# Patient Record
Sex: Male | Born: 1955 | Race: White | Hispanic: No | Marital: Married | Smoking: Current every day smoker
Health system: Southern US, Community
[De-identification: ages and names within clinical notes are randomized; demographics above are authoritative.]

## PROBLEM LIST (undated history)

## (undated) DIAGNOSIS — F17210 Nicotine dependence, cigarettes, uncomplicated: Secondary | ICD-10-CM

## (undated) DIAGNOSIS — I219 Acute myocardial infarction, unspecified: Secondary | ICD-10-CM

## (undated) DIAGNOSIS — Z95828 Presence of other vascular implants and grafts: Secondary | ICD-10-CM

## (undated) DIAGNOSIS — E7800 Pure hypercholesterolemia, unspecified: Secondary | ICD-10-CM

## (undated) DIAGNOSIS — K219 Gastro-esophageal reflux disease without esophagitis: Secondary | ICD-10-CM

## (undated) DIAGNOSIS — Z122 Encounter for screening for malignant neoplasm of respiratory organs: Secondary | ICD-10-CM

## (undated) HISTORY — PX: APPENDECTOMY: SHX54

## (undated) HISTORY — PX: HERNIA REPAIR: SHX51

---

## 2015-10-19 ENCOUNTER — Emergency Department
Admission: EM | Admit: 2015-10-19 | Discharge: 2015-10-19 | Disposition: A | Discharge status: Home / Self Care | Payer: Medicare HMO | Attending: Emergency Medicine | Admitting: Emergency Medicine

## 2015-10-19 ENCOUNTER — Encounter: Payer: Self-pay | Admitting: *Deleted

## 2015-10-19 ENCOUNTER — Emergency Department: Payer: Medicare HMO

## 2015-10-19 DIAGNOSIS — I252 Old myocardial infarction: Secondary | ICD-10-CM | POA: Diagnosis not present

## 2015-10-19 DIAGNOSIS — G629 Polyneuropathy, unspecified: Secondary | ICD-10-CM | POA: Insufficient documentation

## 2015-10-19 DIAGNOSIS — R079 Chest pain, unspecified: Secondary | ICD-10-CM | POA: Diagnosis not present

## 2015-10-19 DIAGNOSIS — F17200 Nicotine dependence, unspecified, uncomplicated: Secondary | ICD-10-CM | POA: Insufficient documentation

## 2015-10-19 HISTORY — DX: Acute myocardial infarction, unspecified: I21.9

## 2015-10-19 HISTORY — DX: Presence of other vascular implants and grafts: Z95.828

## 2015-10-19 LAB — BASIC METABOLIC PANEL
ANION GAP: 8 (ref 5–15)
BUN: 9 mg/dL (ref 6–20)
CALCIUM: 9.2 mg/dL (ref 8.9–10.3)
CO2: 29 mmol/L (ref 22–32)
Chloride: 104 mmol/L (ref 101–111)
Creatinine, Ser: 1.08 mg/dL (ref 0.61–1.24)
GLUCOSE: 182 mg/dL — AB (ref 65–99)
POTASSIUM: 4 mmol/L (ref 3.5–5.1)
Sodium: 141 mmol/L (ref 135–145)

## 2015-10-19 LAB — CBC
HEMATOCRIT: 46.9 % (ref 40.0–52.0)
HEMOGLOBIN: 15.7 g/dL (ref 13.0–18.0)
MCH: 30.7 pg (ref 26.0–34.0)
MCHC: 33.5 g/dL (ref 32.0–36.0)
MCV: 91.7 fL (ref 80.0–100.0)
Platelets: 214 10*3/uL (ref 150–440)
RBC: 5.11 MIL/uL (ref 4.40–5.90)
RDW: 13.1 % (ref 11.5–14.5)
WBC: 8.8 10*3/uL (ref 3.8–10.6)

## 2015-10-19 LAB — TROPONIN I

## 2015-10-19 MED ORDER — GABAPENTIN 300 MG PO CAPS
300.0000 mg | ORAL_CAPSULE | Freq: Once | ORAL | Status: AC
  Administered 2015-10-19: 300 mg via ORAL

## 2015-10-19 MED ORDER — GABAPENTIN 300 MG PO CAPS
ORAL_CAPSULE | ORAL | Status: AC
  Administered 2015-10-19: 300 mg via ORAL
  Filled 2015-10-19: qty 1

## 2015-10-19 MED ORDER — GABAPENTIN 300 MG PO CAPS: 300.0000 mg | ORAL_CAPSULE | Freq: Two times a day (BID) | ORAL | Status: AC

## 2015-10-19 NOTE — ED Notes (Signed)
Pt c/o on/off again CP for last 2 weeks.  Pt sts CP is worse w/ exertion.  Pt denies n/v, diaphoresis, change in LOC.  Pt also c/o shoulder pain that he sts is separate from CP.

## 2015-10-19 NOTE — ED Notes (Addendum)
States left sided chest pain and left arm numbness, also states abd pain from a previous hernia, states some SOB when he breathes hard, states hx of MI and stents, states pain feels the same as previous MI, pt states he is on blood thinners but does not recall which one

## 2015-10-19 NOTE — Discharge Instructions (Signed)
You have been seen in the emergency department today for chest pain. Your workup has shown normal results. As we discussed please follow-up with your primary care physician in the next 1-2 days for recheck. Return to the emergency department for any further chest pain, trouble breathing, or any other symptom personally concerning to yourself. ° °Nonspecific Chest Pain °It is often hard to find the cause of chest pain. There is always a chance that your pain could be related to something serious, such as a heart attack or a blood clot in your lungs. Chest pain can also be caused by conditions that are not life-threatening. If you have chest pain, it is very important to follow up with your doctor. ° °HOME CARE °· If you were prescribed an antibiotic medicine, finish it all even if you start to feel better. °· Avoid any activities that cause chest pain. °· Do not use any tobacco products, including cigarettes, chewing tobacco, or electronic cigarettes. If you need help quitting, ask your doctor. °· Do not drink alcohol. °· Take medicines only as told by your doctor. °· Keep all follow-up visits as told by your doctor. This is important. This includes any further testing if your chest pain does not go away. °· Your doctor may tell you to keep your head raised (elevated) while you sleep. °· Make lifestyle changes as told by your doctor. These may include: °¨ Getting regular exercise. Ask your doctor to suggest some activities that are safe for you. °¨ Eating a heart-healthy diet. Your doctor or a diet specialist (dietitian) can help you to learn healthy eating options. °¨ Maintaining a healthy weight. °¨ Managing diabetes, if necessary. °¨ Reducing stress. °GET HELP IF: °· Your chest pain does not go away, even after treatment. °· You have a rash with blisters on your chest. °· You have a fever. °GET HELP RIGHT AWAY IF: °· Your chest pain is worse. °· You have an increasing cough, or you cough up blood. °· You have  severe belly (abdominal) pain. °· You feel extremely weak. °· You pass out (faint). °· You have chills. °· You have sudden, unexplained chest discomfort. °· You have sudden, unexplained discomfort in your arms, back, neck, or jaw. °· You have shortness of breath at any time. °· You suddenly start to sweat, or your skin gets clammy. °· You feel nauseous. °· You vomit. °· You suddenly feel light-headed or dizzy. °· Your heart begins to beat quickly, or it feels like it is skipping beats. °These symptoms may be an emergency. Do not wait to see if the symptoms will go away. Get medical help right away. Call your local emergency services (911 in the [place] Do not drive yourself to the hospital. °  °This information is not intended to replace advice given to you by your health care provider. Make sure you discuss any questions you have with your health care provider. °  °Document Released: 03/07/2008 Document Revised: 10/10/2014 Document Reviewed: 04/25/2014 °Elsevier Interactive Patient Education ©2016 [employer] ° °

## 2015-10-19 NOTE — ED Provider Notes (Signed)
[facility] Emergency Department Provider Note  Time seen: 6:24 PM  I have reviewed the triage vital signs and the nursing notes.   HISTORY  Chief Complaint Chest Pain    HPI Harry Woods is a 60 y.o. male with a past medical history of MI status post stents 10 years ago who presents the emergency department with vague complaints of intermittent left arm pain and tingling for the past 6 months, neck pain for the past several years, and occasional left chest pain for the past several months. Denies any acute change today but states he just wanted to come get checked out to make sure his heart was okay as he has a history of cardiac stents 10 years ago. Denies any symptoms at this time signs some pain and tingling in his left upper extremity which she states again has been ongoing for many months. Denies any worsening today. Describes his chest pain as intermittent, mild for the past several months. Denies any acute worsening. Denies any shortness of breath, nausea, vomiting, diaphoresis. Patient states a history of a hiatal hernia, but denies any complications surrounding that, states normal bowel movements.    Past Medical History  Diagnosis Date  . H/O hernia repair   . MI (myocardial infarction) (HCC)   . Presence of stent in artery     There are no active problems to display for this patient.   Past Surgical History  Procedure Laterality Date  . Appendectomy      No current outpatient prescriptions on file.  Allergies Review of patient's allergies indicates no known allergies.  History reviewed. No pertinent family history.  Social History Social History  Substance Use Topics  . Smoking status: Current Every Day Smoker  . Smokeless tobacco: None  . Alcohol Use: None    Review of Systems Constitutional: Negative for fever. Cardiovascular: Admits chest pain times months. Respiratory: Negative for shortness of breath. Gastrointestinal:  Negative for abdominal pain Neurological: Negative for headache 10-point ROS otherwise negative.  ____________________________________________   PHYSICAL EXAM:  VITAL SIGNS: ED Triage Vitals  Enc Vitals Group     BP 10/19/15 1614 149/84 mmHg     Pulse Rate 10/19/15 1614 86     Resp 10/19/15 1614 18     Temp 10/19/15 1614 98.4 F (36.9 C)     Temp Source 10/19/15 1614 Oral     SpO2 10/19/15 1614 100 %     Weight 10/19/15 1614 140 lb (63.504 kg)     Height 10/19/15 1614 5\' 2"  (1.575 m)     Head Cir --      Peak Flow --      Pain Score 10/19/15 1614 6     Pain Loc --      Pain Edu? --      Excl. in GC? --     Constitutional: Alert and oriented. Well appearing and in no distress. Eyes: Normal exam ENT   Head: Normocephalic and atraumatic   Mouth/Throat: Mucous membranes are moist. Cardiovascular: Normal rate, regular rhythm. No murmur Respiratory: Normal respiratory effort without tachypnea nor retractions. Breath sounds are clear and equal bilaterally. No wheezes/rales/rhonchi. Gastrointestinal: Soft and nontender. No distention.  Musculoskeletal: Nontender with normal range of motion in all extremities. Neurologic:  Normal speech and language. No gross focal neurologic deficits are appreciated. Speech is normal. Equal grip strength bilateral upper extremities. Skin:  Skin is warm, dry and intact.  Psychiatric: Mood and affect are normal. Speech and behavior are  normal.  ____________________________________________    EKG  EKG reviewed and interpreted by myself shows normal sinus rhythm at 85 bpm, narrow QRS, normal axis, normal intervals, no ST changes. Overall normal EKG.  ____________________________________________    RADIOLOGY Chest x-ray shows no acute abnormalities  ____________________________________________    INITIAL IMPRESSION / ASSESSMENT AND PLAN / ED COURSE  Pertinent labs & imaging results that were available during my care of the  patient were reviewed by me and considered in my medical decision making (see chart for details).  Patient presents to the emergency department with intermittent left upper extremity numbness and tingling, intermittent chest pain, neck pain, SYMPTOMS have been ongoing for many months, some of which have been ongoing for several years. Denies any acute worsening. EKG is reassuring, chest x-ray is clear, labs within normal limits including a negative troponin. I discussed with the patient and need to follow-up with a primary care physician, he does not have one currently I will prefer him to [facility]. Patient is agreeable to plan. Given his likely neuropathy we will prescribe gabapentin and have him follow-up with his primary care physician soon as possible.  ____________________________________________   FINAL CLINICAL IMPRESSION(S) / ED DIAGNOSES  Chest pain Neuropathy   Minna AntisKevin Aviyon Hocevar, MD 10/19/15 [phone]

## 2016-02-20 ENCOUNTER — Emergency Department: Payer: Medicare HMO

## 2016-02-20 ENCOUNTER — Encounter: Payer: Self-pay | Admitting: Emergency Medicine

## 2016-02-20 ENCOUNTER — Emergency Department
Admission: EM | Admit: 2016-02-20 | Discharge: 2016-02-21 | Disposition: A | Discharge status: Home / Self Care | Payer: Medicare HMO | Attending: Student | Admitting: Student

## 2016-02-20 DIAGNOSIS — I252 Old myocardial infarction: Secondary | ICD-10-CM | POA: Diagnosis not present

## 2016-02-20 DIAGNOSIS — Z955 Presence of coronary angioplasty implant and graft: Secondary | ICD-10-CM | POA: Diagnosis not present

## 2016-02-20 DIAGNOSIS — R1111 Vomiting without nausea: Secondary | ICD-10-CM | POA: Insufficient documentation

## 2016-02-20 DIAGNOSIS — Z79899 Other long term (current) drug therapy: Secondary | ICD-10-CM | POA: Diagnosis not present

## 2016-02-20 DIAGNOSIS — F17210 Nicotine dependence, cigarettes, uncomplicated: Secondary | ICD-10-CM | POA: Insufficient documentation

## 2016-02-20 DIAGNOSIS — R1033 Periumbilical pain: Secondary | ICD-10-CM | POA: Diagnosis present

## 2016-02-20 HISTORY — DX: Gastro-esophageal reflux disease without esophagitis: K21.9

## 2016-02-20 HISTORY — DX: Pure hypercholesterolemia, unspecified: E78.00

## 2016-02-20 LAB — CBC
HEMATOCRIT: 48.9 % (ref 40.0–52.0)
HEMOGLOBIN: 16.9 g/dL (ref 13.0–18.0)
MCH: 31.1 pg (ref 26.0–34.0)
MCHC: 34.5 g/dL (ref 32.0–36.0)
MCV: 90.2 fL (ref 80.0–100.0)
Platelets: 126 10*3/uL — ABNORMAL LOW (ref 150–440)
RBC: 5.42 MIL/uL (ref 4.40–5.90)
RDW: 13.2 % (ref 11.5–14.5)
WBC: 9.3 10*3/uL (ref 3.8–10.6)

## 2016-02-20 LAB — COMPREHENSIVE METABOLIC PANEL
ALBUMIN: 4.6 g/dL (ref 3.5–5.0)
ALT: 20 U/L (ref 17–63)
ANION GAP: 8 (ref 5–15)
AST: 23 U/L (ref 15–41)
Alkaline Phosphatase: 78 U/L (ref 38–126)
BUN: 10 mg/dL (ref 6–20)
CO2: 28 mmol/L (ref 22–32)
Calcium: 9.5 mg/dL (ref 8.9–10.3)
Chloride: 102 mmol/L (ref 101–111)
Creatinine, Ser: 1.1 mg/dL (ref 0.61–1.24)
GFR calc non Af Amer: >60 mL/min (ref >60)
GLUCOSE: 146 mg/dL — AB (ref 65–99)
POTASSIUM: 3.5 mmol/L (ref 3.5–5.1)
SODIUM: 138 mmol/L (ref 135–145)
Total Bilirubin: 0.9 mg/dL (ref 0.3–1.2)
Total Protein: 7.7 g/dL (ref 6.5–8.1)

## 2016-02-20 LAB — URINALYSIS COMPLETE WITH MICROSCOPIC (ARMC ONLY)
BACTERIA UA: NONE SEEN
Bilirubin Urine: NEGATIVE
GLUCOSE, UA: NEGATIVE mg/dL
Ketones, ur: NEGATIVE mg/dL
Leukocytes, UA: NEGATIVE
NITRITE: NEGATIVE
PROTEIN: NEGATIVE mg/dL
SPECIFIC GRAVITY, URINE: 1.01 (ref 1.005–1.030)
pH: 5 (ref 5.0–8.0)

## 2016-02-20 LAB — TROPONIN I

## 2016-02-20 LAB — LIPASE, BLOOD: LIPASE: 23 U/L (ref 11–51)

## 2016-02-20 MED ORDER — IOPAMIDOL (ISOVUE-300) INJECTION 61%
100.0000 mL | Freq: Once | INTRAVENOUS | Status: AC | PRN
  Administered 2016-02-20: 100 mL via INTRAVENOUS

## 2016-02-20 MED ORDER — ONDANSETRON HCL 4 MG/2ML IJ SOLN
4.0000 mg | Freq: Once | INTRAMUSCULAR | Status: AC
  Administered 2016-02-20: 4 mg via INTRAVENOUS
  Filled 2016-02-20: qty 2

## 2016-02-20 MED ORDER — DIATRIZOATE MEGLUMINE & SODIUM 66-10 % PO SOLN
15.0000 mL | Freq: Once | ORAL | Status: AC
  Administered 2016-02-20: 15 mL via ORAL

## 2016-02-20 MED ORDER — MORPHINE SULFATE (PF) 4 MG/ML IV SOLN
4.0000 mg | Freq: Once | INTRAVENOUS | Status: AC
  Administered 2016-02-20: 4 mg via INTRAVENOUS
  Filled 2016-02-20: qty 1

## 2016-02-20 MED ORDER — SODIUM CHLORIDE 0.9 % IV BOLUS (SEPSIS)
500.0000 mL | Freq: Once | INTRAVENOUS | Status: AC
  Administered 2016-02-20: 500 mL via INTRAVENOUS

## 2016-02-20 MED ORDER — ONDANSETRON 4 MG PO TBDP: 4.0000 mg | ORAL_TABLET | Freq: Three times a day (TID) | ORAL | Status: AC | PRN

## 2016-02-20 NOTE — ED Notes (Addendum)
Pt c/o low midline abd pain; vomiting; denies urinary s/s; pt says he thinks the mesh from his hernia surgery 9 years ago has moved; pt says pain is constant sharp pain, worse when standing and moving;

## 2016-02-20 NOTE — ED Provider Notes (Signed)
[facility] Emergency Department Provider Note   ____________________________________________  Time seen: Approximately 10:32 PM  I have reviewed the triage vital signs and the nursing notes.   HISTORY  Chief Complaint Abdominal Pain    HPI Harry Woods is a 60 y.o. male history of coronary artery disease, GERD, HTN, hyperlipidemia history of appendectomy, history of hernia repair with mesh who presents for evaluation of 2-3 days of vomiting as well as periumbilical and lower abdominal pain, gradual onset, constant, moderate, no modifying factors. Patient reports that yesterday he had 3 episodes of nonbloody nonbilious emesis, one episode today. No fevers or chills. No diarrhea. No chest pain or difficulty breathing.   Past Medical History  Diagnosis Date  . MI (myocardial infarction) (HCC)   . Presence of stent in artery   . GERD (gastroesophageal reflux disease)   . Hypercholesteremia     There are no active problems to display for this patient.   Past Surgical History  Procedure Laterality Date  . Appendectomy    . Hernia repair      Current Outpatient Rx  Name  Route  Sig  Dispense  Refill  . famotidine (PEPCID) 20 MG tablet   Oral   Take 20 mg by mouth 2 (two) times daily.         Marland Kitchen lisinopril (PRINIVIL,ZESTRIL) 5 MG tablet   Oral   Take 5 mg by mouth 2 (two) times daily.         . meloxicam (MOBIC) 15 MG tablet   Oral   Take 15 mg by mouth daily.         . metoprolol tartrate (LOPRESSOR) 25 MG tablet   Oral   Take 25 mg by mouth 2 (two) times daily.         . niacin (NIASPAN) 500 MG CR tablet   Oral   Take 500 mg by mouth at bedtime.         . simvastatin (ZOCOR) 40 MG tablet   Oral   Take 40 mg by mouth at bedtime.         . gabapentin (NEURONTIN) 300 MG capsule   Oral   Take 1 capsule (300 mg total) by mouth 2 (two) times daily. Patient not taking: Reported on 02/20/2016   60 capsule   0      Allergies Review of patient's allergies indicates no known allergies.  History reviewed. No pertinent family history.  Social History Social History  Substance Use Topics  . Smoking status: Current Every Day Smoker -- 1.00 packs/day for 46 years    Types: Cigarettes  . Smokeless tobacco: None  . Alcohol Use: No    Review of Systems Constitutional: No fever/chills Eyes: No visual changes. ENT: No sore throat. Cardiovascular: Denies chest pain. Respiratory: Denies shortness of breath. Gastrointestinal: + abdominal pain.  + nausea, + vomiting.  No diarrhea.  No constipation. Genitourinary: Negative for dysuria. Musculoskeletal: Negative for back pain. Skin: Negative for rash. Neurological: Negative for headaches, focal weakness or numbness.  10-point ROS otherwise negative.  ____________________________________________   PHYSICAL EXAM:  Filed Vitals:   02/20/16 1939 02/20/16 2149  BP: 145/97 163/102  Pulse: 83 78  Temp: 98 F (36.7 C)   TempSrc: Oral   Resp: 18 18  Height:  (1.575 m)   Weight: 145 lb (65.772 kg)   SpO2: 97% 100%    VITAL SIGNS: ED Triage Vitals  Enc Vitals Group     BP 02/20/16  1939 145/97 mmHg     Pulse Rate 02/20/16 1939 83     Resp 02/20/16 1939 18     Temp 02/20/16 1939 98 F (36.7 C)     Temp Source 02/20/16 1939 Oral     SpO2 02/20/16 1939 97 %     Weight 02/20/16 1939 145 lb (65.772 kg)     Height 02/20/16 1939 5\' 2"  (1.575 m)     Head Cir --      Peak Flow --      Pain Score 02/20/16 1940 5     Pain Loc --      Pain Edu? --      Excl. in GC? --     Constitutional: Alert and oriented. Well appearing and in no acute distress. Eyes: Conjunctivae are normal. PERRL. EOMI. Head: Atraumatic. Nose: No congestion/rhinnorhea. Mouth/Throat: Mucous membranes are moist.  Oropharynx non-erythematous. Neck: No stridor.  Cardiovascular: Normal rate, regular rhythm. Grossly normal heart sounds.  Good peripheral  circulation. Respiratory: Normal respiratory effort.  No retractions. Lungs CTAB. Gastrointestinal: Soft With moderate periumbilical and lower abdominal tenderness, no rebound or guarding. No CVA tenderness. Genitourinary: deferred Musculoskeletal: No lower extremity tenderness nor edema.  No joint effusions. Neurologic:  Normal speech and language. No gross focal neurologic deficits are appreciated. No gait instability. Skin:  Skin is warm, dry and intact. No rash noted. Psychiatric: Mood and affect are normal. Speech and behavior are normal.  ____________________________________________   LABS (all labs ordered are listed, but only abnormal results are displayed)  Labs Reviewed  COMPREHENSIVE METABOLIC PANEL - Abnormal; Notable for the following:    Glucose, Bld 146 (*)    All other components within normal limits  CBC - Abnormal; Notable for the following:    Platelets 126 (*)    All other components within normal limits  URINALYSIS COMPLETEWITH MICROSCOPIC ([facility] ONLY) - Abnormal; Notable for the following:    Color, Urine YELLOW (*)    APPearance CLEAR (*)    Hgb urine dipstick 1+ (*)    Squamous Epithelial / LPF 0-5 (*)    All other components within normal limits  LIPASE, BLOOD  TROPONIN I   ____________________________________________  EKG  ED ECG REPORT I, Gayla DossGayle, Jermario Kalmar A, the attending physician, personally viewed and interpreted this ECG.   Date: 02/20/2016  EKG Time: 22:49  Rate: 85  Rhythm: normal sinus rhythm  Axis: normal  Intervals:none  ST&T Change: No acute ST elevation. RS are prime in V1.  ____________________________________________  RADIOLOGY  CT abdomen and pelvis IMPRESSION: 1. No acute abnormalities involving the abdomen or pelvis. 2. 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 3. Moderate prostate gland enlargement, particularly the median lobe. 4. Left adrenal mass statistically likely to  represent an adenoma.  ____________________________________________   PROCEDURES  Procedure(s) performed: None  Critical Care performed: No  ____________________________________________   INITIAL IMPRESSION / ASSESSMENT AND PLAN / ED COURSE  Pertinent labs & imaging results that were available during my care of the patient were reviewed by me and considered in my medical decision making (see chart for details).  Harry Woods is a 60 y.o. male history of coronary artery disease, GERD, HTN, hyperlipidemia history of appendectomy, history of hernia repair with mesh who presents for evaluation of 2-3 days of vomiting as well as periumbilical and lower abdominal pain. On exam, he is very well-appearing and in no acute distress, vital signs stable, he is afebrile. He does have significant tenderness in the  periumbilical region as well as the lower abdomen. EKG reassuring, doubt atypical ACS. CBC notable for mild thrombocytopenia, unremarkable CMP, normal lipase. Urinalysis not  consistent with infection. Given his age and significant tenderness, we'll obtain CT of the abdomen and pelvis to further evaluate the cause of his pain. We'll treat with morphine, Zofran, fluids, reassess for disposition.  ----------------------------------------- 11:17 PM on 02/20/2016 ----------------------------------------- Patient reports complete resolution of his nonspecific abdominal  pain at this time. He is tolerating by mouth intake without vomiting. CT scan shows no acute abnormality of the abdomen or pelvis. Troponin negative. We discussed return precautions, need for close PCP follow-up and he is comfortable with the discharge plan. DC home. ____________________________________________   FINAL CLINICAL IMPRESSION(S) / ED DIAGNOSES  Final diagnoses:  Periumbilical abdominal pain  Non-intractable vomiting without nausea, unspecified vomiting type      NEW MEDICATIONS STARTED DURING THIS  VISIT:  New Prescriptions   No medications on file     Note:  This document was prepared using Dragon voice recognition software and may include unintentional dictation errors.    Gayla Doss, MD 02/20/16 2320

## 2017-01-18 IMAGING — CT CT ABD-PELV W/ CM
2 of 5 series · 16 of 46 positions shown, 18 images · IV contrast (iopamidol)
Comparison: None.

CLINICAL DATA: 59-year-old presenting with 3 day history of
progressively worsening generalized abdominal pain associated with
nausea and vomiting. Surgical history includes appendectomy and
umbilical hernia repair.

EXAM:
CT ABDOMEN AND PELVIS WITH CONTRAST
TECHNIQUE: Multidetector CT imaging of the abdomen and pelvis was performed
using the standard protocol following bolus administration of
intravenous contrast.
CONTRAST:  100mL [id number] IOPAMIDOL 61% IV. Oral contrast was also
administered.

[Series 2: routine abd pel with · axial · 0.73mm/px · z∈[-440,-75]mm · 13 of 83 slices shown, 15 images]
[im 5/83  soft-tissue]
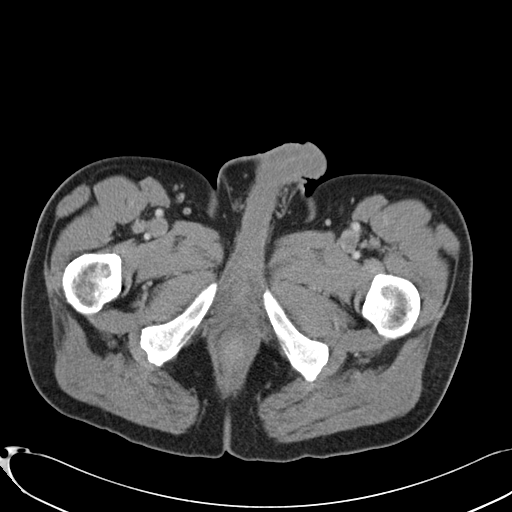
[im 5/83  bone]
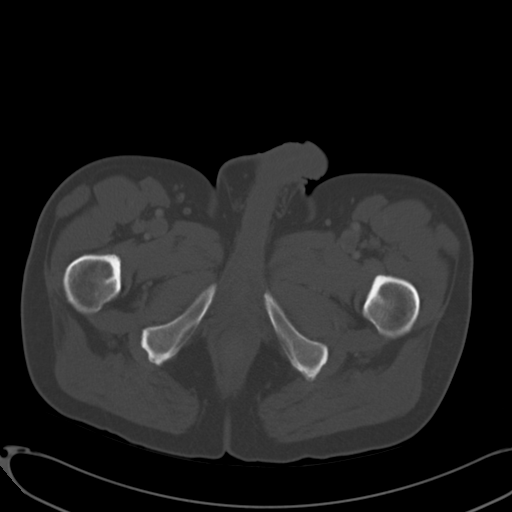
[im 13/83  soft-tissue]
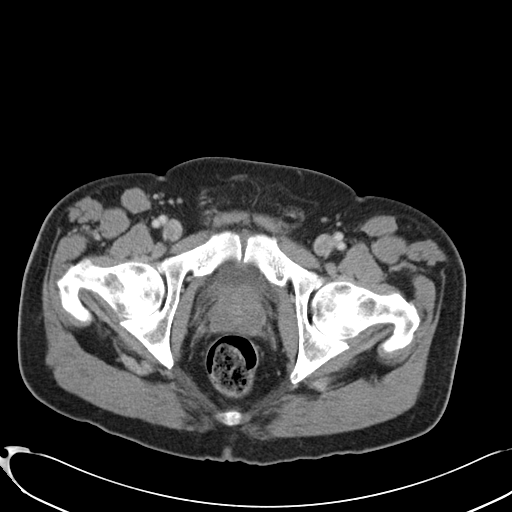
[im 18/83  soft-tissue]
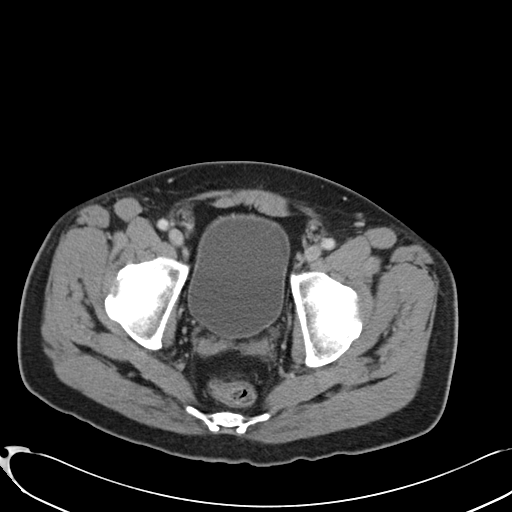
[im 22/83  soft-tissue]
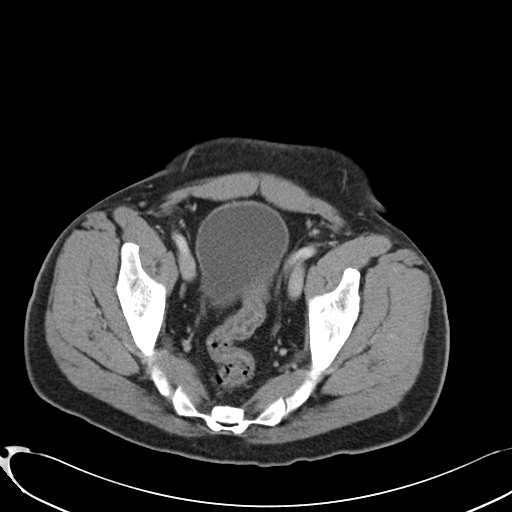
[im 31/83  soft-tissue]
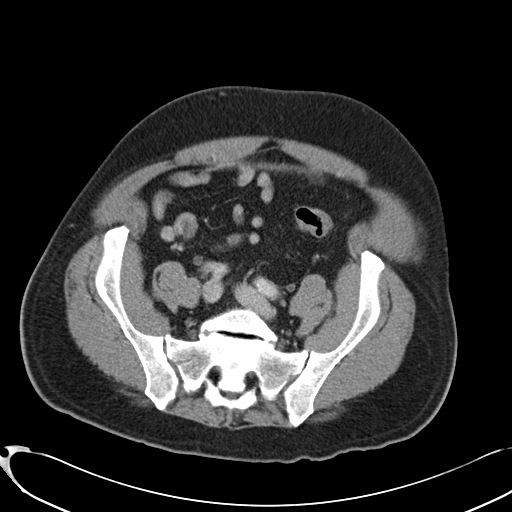
[im 35/83  soft-tissue]
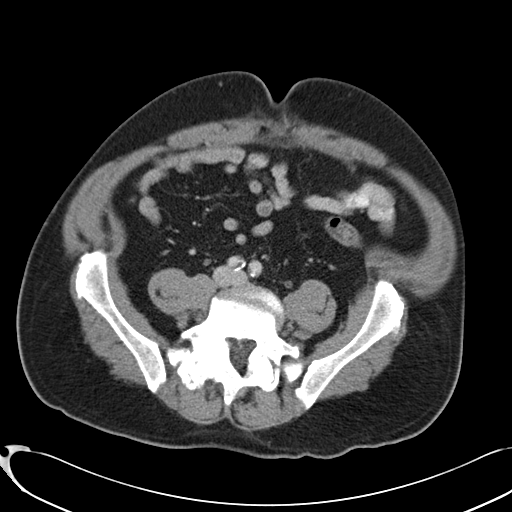
[im 44/83  soft-tissue]
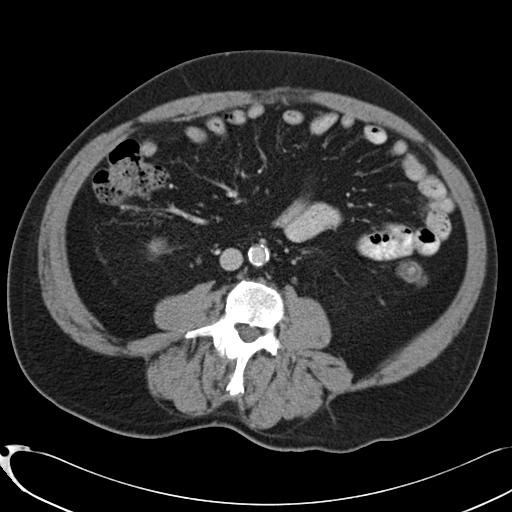
[im 48/83  soft-tissue]
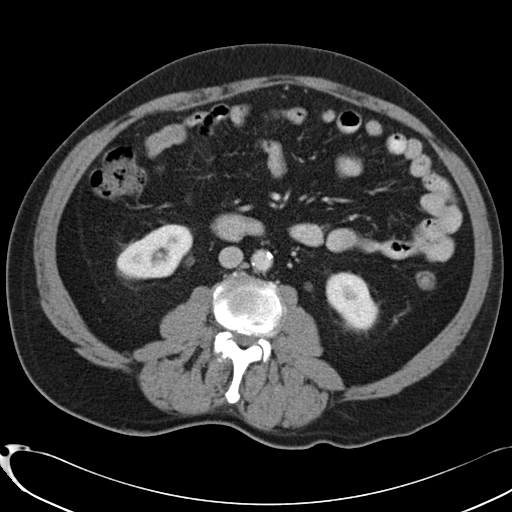
[im 52/83  soft-tissue]
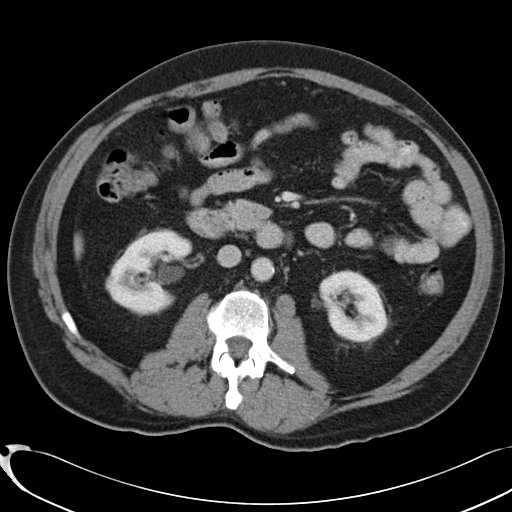
[im 52/83  bone]
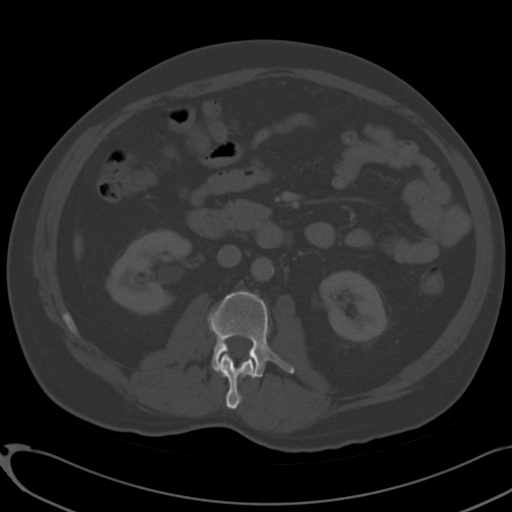
[im 61/83  soft-tissue]
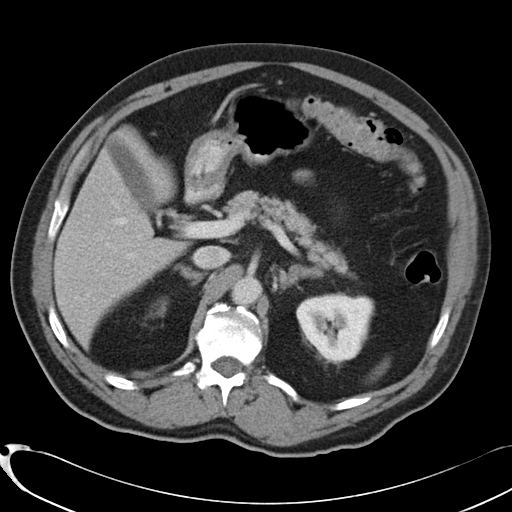
[im 65/83  soft-tissue]
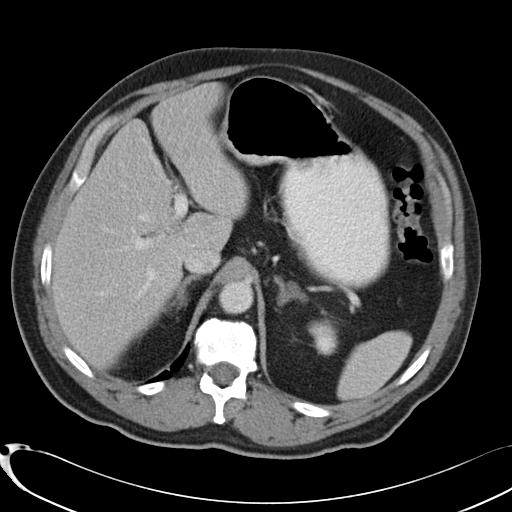
[im 70/83  soft-tissue]
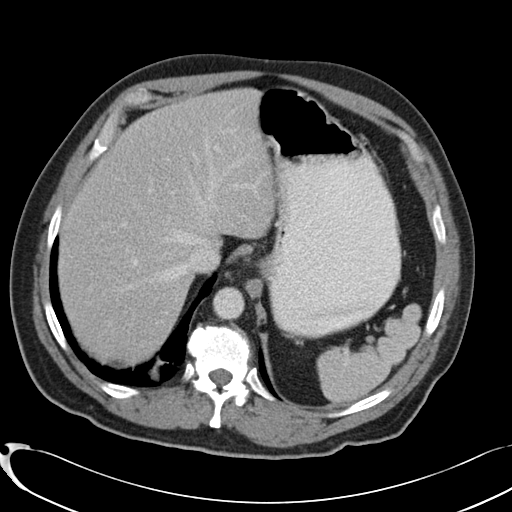
[im 78/83  soft-tissue]
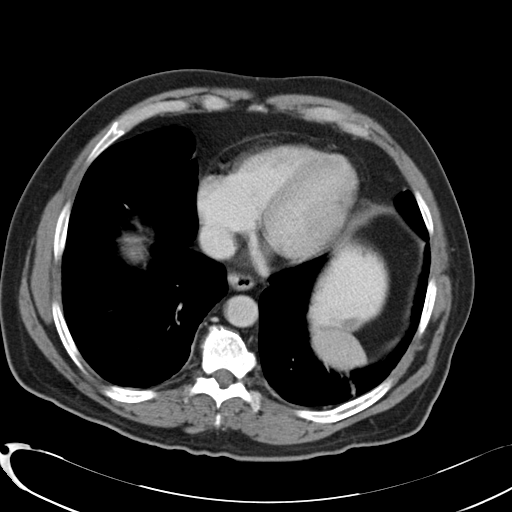

[Series 5: cor routine abd pel with · coronal · 0.70mm/px · 3 of 145 slices shown]
[im 49/145  soft-tissue]
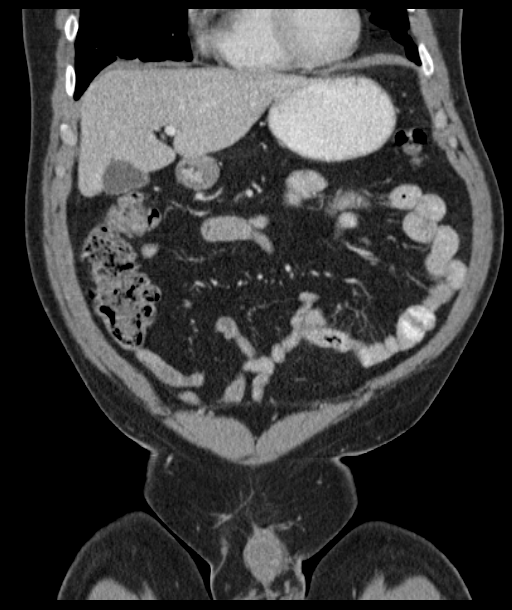
[im 65/145  soft-tissue]
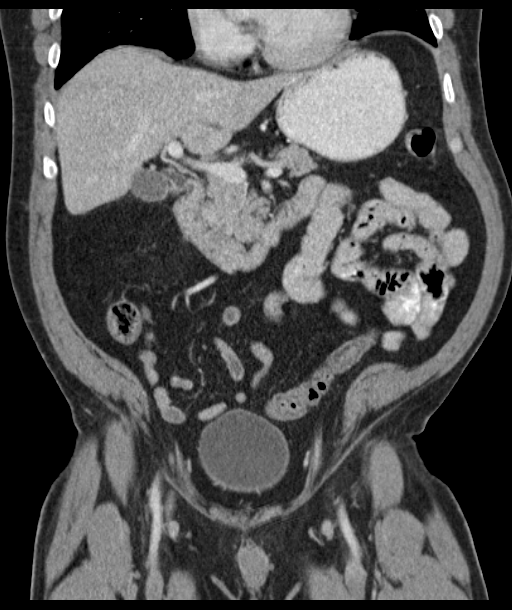
[im 81/145  soft-tissue]
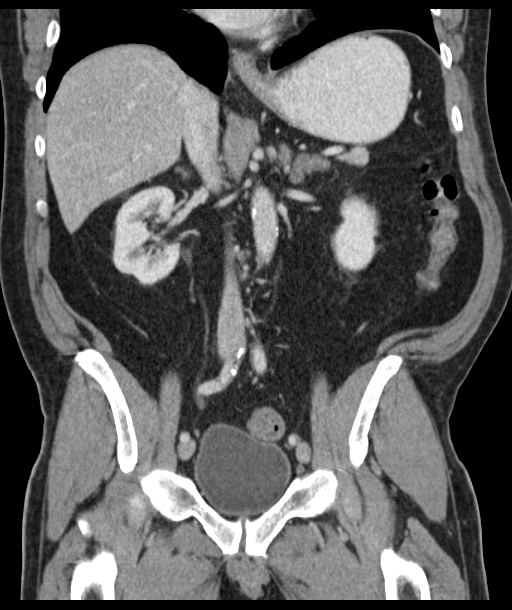

[16 of 46 positions shown; findings below may reference images not displayed]

FINDINGS: Lower chest: Linear scar/atelectasis involving the lower lobes and
lingula. Visualized lung bases otherwise clear. Normal heart size.

Hepatobiliary: Liver normal in size and appearance. Gallbladder
normal in appearance without calcified gallstones. No biliary ductal
dilation.

Pancreas: Normal in appearance without evidence of mass, ductal
dilation, or inflammation.

Spleen: Normal in size and appearance. Small focus of accessory
splenic tissue medial to the spleen above the hilum.

Adrenals/Urinary Tract: Approximate 1.4 x 2.1 x 2.7 cm low density
left adrenal mass. Normal right adrenal gland. Kidneys normal in
size and appearance without focal parenchymal abnormality. No
evidence of urinary tract calculi or obstruction. Normal-appearing
urinary bladder.

Stomach/Bowel: Stomach normal in appearance for the degree of
distention. Normal-appearing small bowel. Entire colon decompressed
and unremarkable. Appendix surgically absent.

Vascular/Lymphatic: Moderate to severe aortoiliofemoral
atherosclerosis without aneurysm. Patent visceral arteries.
Normal-appearing portal venous and systemic venous systems.

No pathologic lymphadenopathy.

Reproductive: Moderate median lobe prostate gland enlargement.
Normal seminal vesicles.

Other: No evidence of recurrent umbilical hernia.

Musculoskeletal: Degenerative disc disease and spondylosis
throughout the lower thoracic and lumbar spine. Severe facet
degenerative changes at L4-5 and L5-S1. Left L5 pars defect.
Degenerative grade 1-2 spondylolisthesis of L5 on S1. No acute
abnormalities.
IMPRESSION: 1. No acute abnormalities involving the abdomen or pelvis.
2. Moderate to severe aortoiliofemoral atherosclerosis without
evidence of aneurysm.
3. Moderate prostate gland enlargement, particularly the median
lobe.
4. Left adrenal mass statistically likely to represent an adenoma.

## 2019-10-10 ENCOUNTER — Encounter

## 2019-12-15 ENCOUNTER — Inpatient Hospital Stay
Admit: 2019-12-15 | Discharge: 2019-12-15 | Disposition: A | Discharge status: Home / Self Care | Payer: MEDICARE | Attending: Emergency Medicine

## 2019-12-15 ENCOUNTER — Emergency Department: Admit: 2019-12-15 | Payer: MEDICARE | Primary: Internal Medicine

## 2019-12-15 DIAGNOSIS — L03113 Cellulitis of right upper limb: Secondary | ICD-10-CM

## 2019-12-15 LAB — CBC WITH AUTOMATED DIFF
ABS. BASOPHILS: 0.1 10*3/uL (ref 0.0–0.2)
ABS. EOSINOPHILS: 0.1 10*3/uL (ref 0.0–0.7)
ABS. LYMPHOCYTES: 3.1 10*3/uL (ref 1.0–4.8)
ABS. MONOCYTES: 1.6 10*3/uL (ref 0.2–2.4)
ABS. NEUTROPHILS: 10.8 10*3/uL — ABNORMAL HIGH (ref 1.8–7.7)
ABSOLUTE NRBC: 0.02 10*3/uL
BASOPHILS: 1 % (ref 0.0–2.5)
EOSINOPHILS: 1 % (ref 0.9–2.9)
HCT: 47.7 % (ref 41–53)
HGB: 16.3 g/dL (ref 13.5–17.5)
LYMPHOCYTES: 20 % — ABNORMAL LOW (ref 20.5–51.1)
MCH: 30.5 PG — ABNORMAL LOW (ref 31–34)
MCHC: 34.1 g/dL (ref 31.0–36.0)
MCV: 89.4 FL (ref 80–100)
MONOCYTES: 10 % — ABNORMAL HIGH (ref 1.7–9.3)
MPV: 8.4 FL (ref 6.5–11.5)
NEUTROPHILS: 68 % (ref 42–75)
NRBC: 0.1 PER 100 WBC
PLATELET: 278 10*3/uL
RBC: 5.33 M/uL (ref 4.50–5.90)
RDW: 13 % (ref 11.5–14.5)
WBC: 15.6 10*3/uL — ABNORMAL HIGH (ref 4.4–11.3)

## 2019-12-15 LAB — METABOLIC PANEL, BASIC
Anion gap: 10 mmol/L (ref 5–15)
BUN/Creatinine ratio: 13 (ref 12–20)
BUN: 17 mg/dL (ref 6–20)
CO2: 28 mmol/L (ref 21–32)
Calcium: 9.3 mg/dL (ref 8.5–10.1)
Chloride: 90 mmol/L — ABNORMAL LOW (ref 97–108)
Creatinine: 1.27 mg/dL (ref 0.70–1.30)
GFR est AA: >60 mL/min/{1.73_m2} (ref >60)
GFR est non-AA: 57 mL/min/{1.73_m2} — ABNORMAL LOW (ref >60)
Glucose: 181 mg/dL — ABNORMAL HIGH (ref 65–100)
Potassium: 3.6 mmol/L (ref 3.5–5.1)
Sodium: 128 mmol/L — ABNORMAL LOW (ref 136–145)

## 2019-12-15 LAB — URIC ACID
Uric Acid, Serum: 5.4 mg/dL (ref 3.5–7.2)
Uric acid: 5.4 mg/dL (ref 3.5–7.2)

## 2019-12-15 LAB — CBC WITH AUTO DIFFERENTIAL
Basophils %: 1 % (ref 0.0–2.5)
Basophils Absolute: 0.1 10*3/uL (ref 0.0–0.2)
Eosinophils %: 1 % (ref 0.9–2.9)
Eosinophils Absolute: 0.1 10*3/uL (ref 0.0–0.7)
Hematocrit: 47.7 % (ref 41–53)
Hemoglobin: 16.3 g/dL (ref 13.5–17.5)
Lymphocytes %: 20 % — ABNORMAL LOW (ref 20.5–51.1)
Lymphocytes Absolute: 3.1 10*3/uL (ref 1.0–4.8)
MCH: 30.5 PG — ABNORMAL LOW (ref 31–34)
MCHC: 34.1 g/dL (ref 31.0–36.0)
MCV: 89.4 FL (ref 80–100)
MPV: 8.4 FL (ref 6.5–11.5)
Monocytes %: 10 % — ABNORMAL HIGH (ref 1.7–9.3)
Monocytes Absolute: 1.6 10*3/uL (ref 0.2–2.4)
NRBC Absolute: 0.02 10*3/uL
Neutrophils %: 68 % (ref 42–75)
Neutrophils Absolute: 10.8 10*3/uL — ABNORMAL HIGH (ref 1.8–7.7)
Nucleated RBCs: 0.1 PER 100 WBC
Platelets: 278 10*3/uL
RBC: 5.33 M/uL (ref 4.50–5.90)
RDW: 13 % (ref 11.5–14.5)
WBC: 15.6 10*3/uL — ABNORMAL HIGH (ref 4.4–11.3)

## 2019-12-15 LAB — BASIC METABOLIC PANEL
Anion Gap: 10 mmol/L (ref 5–15)
BUN: 17 mg/dL (ref 6–20)
Bun/Cre Ratio: 13 (ref 12–20)
CO2: 28 mmol/L (ref 21–32)
Calcium: 9.3 mg/dL (ref 8.5–10.1)
Chloride: 90 mmol/L — ABNORMAL LOW (ref 97–108)
Creatinine: 1.27 mg/dL (ref 0.70–1.30)
EGFR IF NonAfrican American: 57 mL/min/{1.73_m2} — ABNORMAL LOW (ref >60)
GFR African American: >60 mL/min/{1.73_m2} (ref >60)
Glucose: 181 mg/dL — ABNORMAL HIGH (ref 65–100)
Potassium: 3.6 mmol/L (ref 3.5–5.1)
Sodium: 128 mmol/L — ABNORMAL LOW (ref 136–145)

## 2019-12-15 MED ORDER — CEPHALEXIN 500 MG CAP
500 mg | ORAL_CAPSULE | Freq: Three times a day (TID) | ORAL | 0 refills | Status: DC
Start: 2019-12-15 — End: 2020-10-27

## 2019-12-15 MED ORDER — CEPHALEXIN 250 MG CAP
250 mg | ORAL | Status: AC
Start: 2019-12-15 — End: 2019-12-15
  Administered 2019-12-15: 15:00:00 via ORAL

## 2019-12-15 MED ORDER — OXYCODONE-ACETAMINOPHEN 5 MG-325 MG TAB
5-325 mg | ORAL | Status: AC
Start: 2019-12-15 — End: 2019-12-15
  Administered 2019-12-15: 13:00:00 via ORAL

## 2019-12-15 MED FILL — CEPHALEXIN 250 MG CAP: 250 mg | ORAL | Qty: 2

## 2019-12-15 MED FILL — OXYCODONE-ACETAMINOPHEN 5 MG-325 MG TAB: 5-325 mg | ORAL | Qty: 1

## 2019-12-15 NOTE — ED Triage Notes (Signed)
Pt arived to ED via POV with CO R hand swelling onset this morning. Pt denies any injury. States "I woke up with it swollen."

## 2019-12-15 NOTE — ED Notes (Signed)
Pt given discharge and follow up instructions. Pt has no further questions at this time. Discussed prescription with pt. Advised pt to follow up with PCP.

## 2019-12-15 NOTE — ED Notes (Signed)
 Pt arived to ED via POV with CO R hand swelling onset this morning. Pt denies any injury. States I woke up with it swollen.

## 2019-12-15 NOTE — ED Provider Notes (Signed)
ED Provider Notes by Matthias Hughs, MD at 12/15/19 4888                Author: Matthias Hughs, MD  Service: EMERGENCY  Author Type: Physician       Filed: 12/15/19 1042  Date of Service: 12/15/19 0913  Status: Addendum          Editor: Matthias Hughs, MD (Physician)          Related Notes: Original Note by Matthias Hughs, MD (Physician) filed at 12/15/19 1041               EMERGENCY DEPARTMENT HISTORY AND PHYSICAL EXAM           Date: 12/15/2019   Patient Name: Gregory Huffman        History of Presenting Illness          Chief Complaint       Patient presents with        ?  Hand Swelling           History Provided By: Patient      HPI: Gregory Huffman,  64 y.o. male with a past medical history significant  hypertension and obesity presents to the ED with cc of right hand swelling since 2 days ago.  No previous episode of swelling of right hand.Pain scale 10/10.  No hx of gout, insect bite or trauma.   No chest pain or SOB.   There are no other complaints, changes, or physical findings at this time.      PCP: No primary care provider on file.        No current facility-administered medications on file prior to encounter.           No current outpatient medications on file prior to encounter.             Past History        Past Medical History:   No past medical history on file.      Past Surgical History:   No past surgical history on file.      Family History:   No family history on file.      Social History:     Social History          Tobacco Use         ?  Smoking status:  Not on file       Substance Use Topics         ?  Alcohol use:  Not on file         ?  Drug use:  Not on file           Allergies:   No Known Allergies           Review of Systems     Review of Systems    Constitutional: Negative.     HENT: Negative.     Eyes: Negative.     Respiratory: Negative.     Cardiovascular: Negative.     Gastrointestinal: Negative.     Endocrine: Negative.     Genitourinary: Negative.      Musculoskeletal: Positive for myalgias.         Right hand swelling with reddness and pain    Skin: Negative.     Allergic/Immunologic: Negative.     Neurological: Negative.     Hematological: Negative.     Psychiatric/Behavioral: Negative.     All other systems reviewed and are negative.  Physical Exam     Physical Exam   Vitals signs and nursing note reviewed.   Constitutional:        Appearance: Normal appearance.   HENT :       Head: Normocephalic.      Nose: Nose normal.      Mouth/Throat:      Mouth: Mucous membranes are moist.    Eyes:       Pupils: Pupils are equal, round, and reactive to light.   Neck :       Musculoskeletal: Normal range of motion.   Cardiovascular :       Rate and Rhythm: Normal rate.   Pulmonary :       Effort: Pulmonary effort is normal.   Abdominal:      Canaan Prue: Abdomen is flat.     Musculoskeletal: Normal range of motion.          Bennie Scaff: Swelling and tenderness  present.      Comments: Right hand tenderness, swelling with erythematous changes    Skin:      Perkins Molina: Skin is warm.   Neurological :       Anginette Espejo: No focal deficit present.      Mental Status: He is alert and oriented to person, place, and time.    Psychiatric:         Mood and Affect: Mood normal.               Lab and Diagnostic Study Results        Labs -    No results found for this or any previous visit (from the past 12 hour(s)).     Recent Results (from the past 8 hour(s))     CBC WITH AUTOMATED DIFF          Collection Time: 12/15/19  9:15 AM         Result  Value  Ref Range            WBC  15.6 (H)  4.4 - 11.3 K/uL       RBC  5.33  4.50 - 5.90 M/uL       HGB  16.3  13.5 - 17.5 g/dL       HCT  57.3  41 - 53 %       MCV  89.4  80 - 100 FL       MCH  30.5 (L)  31 - 34 PG       MCHC  34.1  31.0 - 36.0 g/dL       RDW  22.0  25.4 - 14.5 %       PLATELET  278  K/uL       MPV  8.4  6.5 - 11.5 FL       NRBC  0.1  PER 100 WBC       ABSOLUTE NRBC  0.02  K/uL       NEUTROPHILS  68  42 - 75 %       LYMPHOCYTES  20  (L)  20.5 - 51.1 %       MONOCYTES  10 (H)  1.7 - 9.3 %       EOSINOPHILS  1  0.9 - 2.9 %       BASOPHILS  1  0.0 - 2.5 %       ABS. NEUTROPHILS  10.8 (H)  1.8 - 7.7 K/UL       ABS. LYMPHOCYTES  3.1  1.0 - 4.8 K/UL       ABS. MONOCYTES  1.6  0.2 - 2.4 K/UL       ABS. EOSINOPHILS  0.1  0.0 - 0.7 K/UL       ABS. BASOPHILS  0.1  0.0 - 0.2 K/UL       METABOLIC PANEL, BASIC          Collection Time: 12/15/19  9:15 AM         Result  Value  Ref Range            Sodium  128 (L)  136 - 145 mmol/L       Potassium  3.6  3.5 - 5.1 mmol/L       Chloride  90 (L)  97 - 108 mmol/L       CO2  28  21 - 32 mmol/L            Anion gap  10  5 - 15 mmol/L            Glucose  181 (H)  65 - 100 mg/dL       BUN  17  6 - 20 mg/dL       Creatinine  3.14  0.70 - 1.30 mg/dL       BUN/Creatinine ratio  13  12 - 20         GFR est AA  >60  >60 ml/min/1.76m2       GFR est non-AA  57 (L)  >60 ml/min/1.64m2       Calcium  9.3  8.5 - 10.1 mg/dL       URIC ACID          Collection Time: 12/15/19  9:15 AM         Result  Value  Ref Range            Uric acid  5.4  3.5 - 7.2 mg/dL        Radiologic Studies -    @lastxrresult @     CT Results   (Last 48 hours)          None                 CXR Results   (Last 48 hours)          None                 CXR Results   (Last 48 hours)          None                       Medical Decision Making     - I am the first provider for this patient.      - I reviewed the vital signs, available nursing notes, past medical history, past surgical history, family history and social history.      - Initial assessment performed. The patients presenting problems have been discussed, and they are in agreement with the care plan formulated and outlined with them.  I have encouraged them to ask questions as they arise throughout their visit.      Vital Signs-Reviewed the patient's vital signs.   No data found.      Records Reviewed: Nursing Notes      The patient presents with right hand swellingdifferential diagnosis of  celluitis of right hand, gout, trauma with fracture   ED Course:  Provider Notes (Medical Decision Making):    MDM            Procedures     Medical Decision Makingedical Decision Making   Performed by: Theola Sequin, MD   PROCEDURES:Procedures            Disposition     Disposition: DC- Adult Discharges: All of the diagnostic tests were reviewed and questions answered. Diagnosis, care  plan and treatment options were discussed.  The patient understands the instructions and will follow up as directed. The patients results have been reviewed with them.  They have been counseled regarding their diagnosis.  The patient verbally convey understanding  and agreement of the signs, symptoms, diagnosis, treatment and prognosis and additionally agrees to follow up as recommended with their PCP in 24 - 48 hours.  They also agree with the care-plan and convey that all of their questions have been answered.   I have also put together some discharge instructions for them that include: 1) educational information regarding their diagnosis, 2) how to care for their diagnosis at home, as well a 3) list of reasons why they would want to return to the ED prior to  their follow-up appointment, should their condition change.            DISCHARGE PLAN:   1. There are no discharge medications for this patient.      2.      Follow-up Information      None             3.  Return to ED if worse    4. There are no discharge medications for this patient.              Diagnosis        Clinical Impression: 1. Celluitis of right hand                ICD-10-CM  ICD-9-CM             1.  Cellulitis of right upper extremity   L03.113  682.3            right           Theola Sequin, MD      Please note that this dictation was completed with Dragon, the computer voice recognition software.  Quite often unanticipated grammatical, syntax, homophones, and other interpretive errors are inadvertently  transcribed by the computer software.   Please disregard these errors.  Please excuse any errors that have escaped final proofreading.  Thank you.

## 2019-12-15 NOTE — ED Notes (Signed)
Pt given discharge and follow up instructions. Pt has no further questions at this time. Discussed prescription with pt. Advised pt to follow up with PCP.

## 2019-12-15 NOTE — ED Provider Notes (Addendum)
EMERGENCY DEPARTMENT HISTORY AND PHYSICAL EXAM      Date: 12/15/2019  Patient Name: Gregory Huffman    History of Presenting Illness     Chief Complaint   Patient presents with   ??? Hand Swelling       History Provided By: Patient    HPI: Gregory Huffman, 64 y.o. male with a past medical history significant hypertension and obesity presents to the ED with cc of right hand swelling since 2 days ago.  No previous episode of swelling of right hand.Pain scale 10/10.  No hx of gout, insect bite or trauma.  No chest pain or SOB.  There are no other complaints, changes, or physical findings at this time.    PCP: No primary care provider on file.    No current facility-administered medications on file prior to encounter.      No current outpatient medications on file prior to encounter.       Past History     Past Medical History:  No past medical history on file.    Past Surgical History:  No past surgical history on file.    Family History:  No family history on file.    Social History:  Social History     Tobacco Use   ??? Smoking status: Not on file   Substance Use Topics   ??? Alcohol use: Not on file   ??? Drug use: Not on file       Allergies:  No Known Allergies      Review of Systems   Review of Systems   Constitutional: Negative.    HENT: Negative.    Eyes: Negative.    Respiratory: Negative.    Cardiovascular: Negative.    Gastrointestinal: Negative.    Endocrine: Negative.    Genitourinary: Negative.    Musculoskeletal: Positive for myalgias.        Right hand swelling with reddness and pain   Skin: Negative.    Allergic/Immunologic: Negative.    Neurological: Negative.    Hematological: Negative.    Psychiatric/Behavioral: Negative.    All other systems reviewed and are negative.      Physical Exam   Physical Exam  Vitals signs and nursing note reviewed.   Constitutional:       Appearance: Normal appearance.   HENT:      Head: Normocephalic.      Nose: Nose normal.      Mouth/Throat:      Mouth: Mucous membranes are  moist.   Eyes:      Pupils: Pupils are equal, round, and reactive to light.   Neck:      Musculoskeletal: Normal range of motion.   Cardiovascular:      Rate and Rhythm: Normal rate.   Pulmonary:      Effort: Pulmonary effort is normal.   Abdominal:      Gregory Huffman: Abdomen is flat.   Musculoskeletal: Normal range of motion.         Gregory Huffman: Swelling and tenderness present.      Comments: Right hand tenderness, swelling with erythematous changes   Skin:     Gregory Huffman: Skin is warm.   Neurological:      Gregory Huffman: No focal deficit present.      Mental Status: He is alert and oriented to person, place, and time.   Psychiatric:         Mood and Affect: Mood normal.         Lab and  Diagnostic Study Results     Labs -   No results found for this or any previous visit (from the past 12 hour(s)).  Recent Results (from the past 8 hour(s))   CBC WITH AUTOMATED DIFF    Collection Time: 12/15/19  9:15 AM   Result Value Ref Range    WBC 15.6 (H) 4.4 - 11.3 K/uL    RBC 5.33 4.50 - 5.90 M/uL    HGB 16.3 13.5 - 17.5 g/dL    HCT 47.7 41 - 53 %    MCV 89.4 80 - 100 FL    MCH 30.5 (L) 31 - 34 PG    MCHC 34.1 31.0 - 36.0 g/dL    RDW 13.0 11.5 - 14.5 %    PLATELET 278 K/uL    MPV 8.4 6.5 - 11.5 FL    NRBC 0.1 PER 100 WBC    ABSOLUTE NRBC 0.02 K/uL    NEUTROPHILS 68 42 - 75 %    LYMPHOCYTES 20 (L) 20.5 - 51.1 %    MONOCYTES 10 (H) 1.7 - 9.3 %    EOSINOPHILS 1 0.9 - 2.9 %    BASOPHILS 1 0.0 - 2.5 %    ABS. NEUTROPHILS 10.8 (H) 1.8 - 7.7 K/UL    ABS. LYMPHOCYTES 3.1 1.0 - 4.8 K/UL    ABS. MONOCYTES 1.6 0.2 - 2.4 K/UL    ABS. EOSINOPHILS 0.1 0.0 - 0.7 K/UL    ABS. BASOPHILS 0.1 0.0 - 0.2 K/UL   METABOLIC PANEL, BASIC    Collection Time: 12/15/19  9:15 AM   Result Value Ref Range    Sodium 128 (L) 136 - 145 mmol/L    Potassium 3.6 3.5 - 5.1 mmol/L    Chloride 90 (L) 97 - 108 mmol/L    CO2 28 21 - 32 mmol/L    Anion gap 10 5 - 15 mmol/L    Glucose 181 (H) 65 - 100 mg/dL    BUN 17 6 - 20 mg/dL    Creatinine 1.27 0.70 - 1.30 mg/dL    BUN/Creatinine  ratio 13 12 - 20      GFR est AA >60 >60 ml/min/1.54m2    GFR est non-AA 57 (L) >60 ml/min/1.38m2    Calcium 9.3 8.5 - 10.1 mg/dL   URIC ACID    Collection Time: 12/15/19  9:15 AM   Result Value Ref Range    Uric acid 5.4 3.5 - 7.2 mg/dL     Radiologic Studies -   @lastxrresult @  CT Results  (Last 48 hours)    None        CXR Results  (Last 48 hours)    None        CXR Results  (Last 48 hours)    None            Medical Decision Making   - I am the first provider for this patient.    - I reviewed the vital signs, available nursing notes, past medical history, past surgical history, family history and social history.    - Initial assessment performed. The patients presenting problems have been discussed, and they are in agreement with the care plan formulated and outlined with them.  I have encouraged them to ask questions as they arise throughout their visit.    Vital Signs-Reviewed the patient's vital signs.  No data found.    Records Reviewed: Nursing Notes    The patient presents with right hand swellingdifferential diagnosis of celluitis of  right hand, gout, trauma with fracture  ED Course:          Provider Notes (Medical Decision Making):   MDM       Procedures   Medical Decision Makingedical Decision Making  Performed by: Matthias Hughs, MD  PROCEDURES:Procedures       Disposition   Disposition: DC- Adult Discharges: All of the diagnostic tests were reviewed and questions answered. Diagnosis, care plan and treatment options were discussed.  The patient understands the instructions and will follow up as directed. The patients results have been reviewed with them.  They have been counseled regarding their diagnosis.  The patient verbally convey understanding and agreement of the signs, symptoms, diagnosis, treatment and prognosis and additionally agrees to follow up as recommended with their PCP in 24 - 48 hours.  They also agree with the care-plan and convey that all of their questions have been answered.  I  have also put together some discharge instructions for them that include: 1) educational information regarding their diagnosis, 2) how to care for their diagnosis at home, as well a 3) list of reasons why they would want to return to the ED prior to their follow-up appointment, should their condition change.        DISCHARGE PLAN:  1. There are no discharge medications for this patient.    2.   Follow-up Information    None       3.  Return to ED if worse   4. There are no discharge medications for this patient.        Diagnosis     Clinical Impression: 1. Celluitis of right hand      ICD-10-CM ICD-9-CM    1. Cellulitis of right upper extremity  L03.113 682.3     right       Matthias Hughs, MD    Please note that this dictation was completed with Dragon, the computer voice recognition software.  Quite often unanticipated grammatical, syntax, homophones, and other interpretive errors are inadvertently transcribed by the computer software.  Please disregard these errors.  Please excuse any errors that have escaped final proofreading.  Thank you.

## 2020-08-31 ENCOUNTER — Inpatient Hospital Stay
Admit: 2020-08-31 | Discharge: 2020-08-31 | Disposition: A | Discharge status: Home / Self Care | Payer: MEDICARE | Attending: Emergency Medicine

## 2020-08-31 ENCOUNTER — Emergency Department: Admit: 2020-08-31 | Payer: MEDICARE | Primary: Internal Medicine

## 2020-08-31 DIAGNOSIS — R079 Chest pain, unspecified: Secondary | ICD-10-CM

## 2020-08-31 LAB — METABOLIC PANEL, COMPREHENSIVE
A-G Ratio: 1 — ABNORMAL LOW (ref 1.1–2.2)
ALT (SGPT): 22 U/L (ref 12–78)
AST (SGOT): 18 U/L (ref 15–37)
Albumin: 3.9 g/dL (ref 3.5–5.0)
Alk. phosphatase: 93 U/L (ref 45–117)
Anion gap: 9 mmol/L (ref 5–15)
BUN/Creatinine ratio: 7 — ABNORMAL LOW (ref 12–20)
BUN: 9 mg/dL (ref 6–20)
Bilirubin, total: 0.5 mg/dL (ref 0.2–1.0)
CO2: 30 mmol/L (ref 21–32)
Calcium: 9.1 mg/dL (ref 8.5–10.1)
Chloride: 99 mmol/L (ref 97–108)
Creatinine: 1.34 mg/dL — ABNORMAL HIGH (ref 0.70–1.30)
GFR est AA: >60 mL/min/{1.73_m2} (ref >60)
GFR est non-AA: 54 mL/min/{1.73_m2} — ABNORMAL LOW (ref >60)
Globulin: 3.8 g/dL (ref 2.0–4.0)
Glucose: 159 mg/dL — ABNORMAL HIGH (ref 65–100)
Potassium: 4.2 mmol/L (ref 3.5–5.1)
Protein, total: 7.7 g/dL (ref 6.4–8.2)
Sodium: 138 mmol/L (ref 136–145)

## 2020-08-31 LAB — CBC WITH AUTOMATED DIFF
ABS. BASOPHILS: 0.1 10*3/uL (ref 0.0–0.2)
ABS. EOSINOPHILS: 0 10*3/uL (ref 0.0–0.7)
ABS. LYMPHOCYTES: 1.6 10*3/uL (ref 1.0–4.8)
ABS. MONOCYTES: 0.7 10*3/uL (ref 0.2–2.4)
ABS. NEUTROPHILS: 8.8 10*3/uL — ABNORMAL HIGH (ref 1.8–7.7)
ABSOLUTE NRBC: 0.02 10*3/uL
BASOPHILS: 1 % (ref 0.0–2.5)
EOSINOPHILS: 0 % — ABNORMAL LOW (ref 0.9–2.9)
HCT: 47.4 % (ref 41–53)
HGB: 16.1 g/dL (ref 13.5–17.5)
LYMPHOCYTES: 14 % — ABNORMAL LOW (ref 20.5–51.1)
MCH: 31.7 PG (ref 31–34)
MCHC: 33.9 g/dL (ref 31.0–36.0)
MCV: 93.4 FL (ref 80–100)
MONOCYTES: 6 % (ref 1.7–9.3)
MPV: 8.5 FL (ref 6.5–11.5)
NEUTROPHILS: 79 % — ABNORMAL HIGH (ref 42–75)
NRBC: 0.2 PER 100 WBC
PLATELET: 236 10*3/uL (ref 150–400)
RBC: 5.07 M/uL (ref 4.50–5.90)
RDW: 13.7 % (ref 11.5–14.5)
WBC: 11.3 10*3/uL (ref 4.4–11.3)

## 2020-08-31 LAB — TROPONIN-HIGH SENSITIVITY: Troponin-High Sensitivity: 7 ng/L (ref 0–76)

## 2020-08-31 LAB — LIPASE
Lipase: 41 U/L — ABNORMAL LOW (ref 73–393)
Lipase: 41 U/L — ABNORMAL LOW (ref 73–393)

## 2020-08-31 LAB — CBC WITH AUTO DIFFERENTIAL
Basophils %: 1 % (ref 0.0–2.5)
Basophils Absolute: 0.1 10*3/uL (ref 0.0–0.2)
Eosinophils %: 0 % — ABNORMAL LOW (ref 0.9–2.9)
Eosinophils Absolute: 0 10*3/uL (ref 0.0–0.7)
Hematocrit: 47.4 % (ref 41–53)
Hemoglobin: 16.1 g/dL (ref 13.5–17.5)
Lymphocytes %: 14 % — ABNORMAL LOW (ref 20.5–51.1)
Lymphocytes Absolute: 1.6 10*3/uL (ref 1.0–4.8)
MCH: 31.7 PG (ref 31–34)
MCHC: 33.9 g/dL (ref 31.0–36.0)
MCV: 93.4 FL (ref 80–100)
MPV: 8.5 FL (ref 6.5–11.5)
Monocytes %: 6 % (ref 1.7–9.3)
Monocytes Absolute: 0.7 10*3/uL (ref 0.2–2.4)
NRBC Absolute: 0.02 10*3/uL
Neutrophils %: 79 % — ABNORMAL HIGH (ref 42–75)
Neutrophils Absolute: 8.8 10*3/uL — ABNORMAL HIGH (ref 1.8–7.7)
Nucleated RBCs: 0.2 PER 100 WBC
Platelets: 236 10*3/uL (ref 150–400)
RBC: 5.07 M/uL (ref 4.50–5.90)
RDW: 13.7 % (ref 11.5–14.5)
WBC: 11.3 10*3/uL (ref 4.4–11.3)

## 2020-08-31 LAB — COMPREHENSIVE METABOLIC PANEL
ALT: 22 U/L (ref 12–78)
AST: 18 U/L (ref 15–37)
Albumin/Globulin Ratio: 1 — ABNORMAL LOW (ref 1.1–2.2)
Albumin: 3.9 g/dL (ref 3.5–5.0)
Alkaline Phosphatase: 93 U/L (ref 45–117)
Anion Gap: 9 mmol/L (ref 5–15)
BUN: 9 mg/dL (ref 6–20)
Bun/Cre Ratio: 7 — ABNORMAL LOW (ref 12–20)
CO2: 30 mmol/L (ref 21–32)
Calcium: 9.1 mg/dL (ref 8.5–10.1)
Chloride: 99 mmol/L (ref 97–108)
Creatinine: 1.34 mg/dL — ABNORMAL HIGH (ref 0.70–1.30)
EGFR IF NonAfrican American: 54 mL/min/{1.73_m2} — ABNORMAL LOW (ref >60)
GFR African American: >60 mL/min/{1.73_m2} (ref >60)
Globulin: 3.8 g/dL (ref 2.0–4.0)
Glucose: 159 mg/dL — ABNORMAL HIGH (ref 65–100)
Potassium: 4.2 mmol/L (ref 3.5–5.1)
Sodium: 138 mmol/L (ref 136–145)
Total Bilirubin: 0.5 mg/dL (ref 0.2–1.0)
Total Protein: 7.7 g/dL (ref 6.4–8.2)

## 2020-08-31 LAB — TROPONIN, HIGH SENSITIVITY: Troponin, High Sensitivity: 7 ng/L (ref 0–76)

## 2020-08-31 MED ORDER — IOPAMIDOL 76 % IV SOLN
76 % | Freq: Once | INTRAVENOUS | Status: AC
Start: 2020-08-31 — End: 2020-08-31
  Administered 2020-08-31: 19:00:00 via INTRAVENOUS

## 2020-08-31 MED ORDER — MORPHINE 4 MG/ML INTRAVENOUS SOLUTION
4 mg/mL | Freq: Once | INTRAVENOUS | Status: AC
Start: 2020-08-31 — End: 2020-08-31
  Administered 2020-08-31: 18:00:00 via INTRAVENOUS

## 2020-08-31 MED ORDER — ONDANSETRON 4 MG TAB, RAPID DISSOLVE
4 mg | ORAL_TABLET | Freq: Three times a day (TID) | ORAL | 0 refills | Status: AC | PRN
Start: 2020-08-31 — End: ?

## 2020-08-31 MED FILL — MORPHINE 4 MG/ML SYRINGE: 4 mg/mL | INTRAMUSCULAR | Qty: 1

## 2020-08-31 MED FILL — ISOVUE-370  76 % INTRAVENOUS SOLUTION: 370 mg iodine /mL (76 %) | INTRAVENOUS | Qty: 100

## 2020-08-31 NOTE — ED Notes (Signed)
.    Chief Complaint   Patient presents with   . Chest Pain     Pt presents with chest pain that pt stated while he was walking two hours prior to arrival, pt rates pain 4/10. Pt states prior history of MI 12 yrs ago. Pt states that pain is intermitent.    . Vomiting     Vomiting starting last night, 6 episodes last night

## 2020-08-31 NOTE — ED Provider Notes (Signed)
HPI   Patient reports 2 days of intermittent vomiting and loss of appetite has not had p.o. in the last 12 hours.  Reports that the vomit has been without provocation and is also accompanied by right lower quadrant pain.  He denies diarrhea, GI bleeding, epigastric pain.  Patient also reports that while ambulating today he developed sharp left sided chest pain that resolved when he rested.  He did not experience shortness of breath, nausea, diaphoresis or radiation of this.  He has a history of coronary disease with MI and stents but is never experienced chest pain with exertion before.  Past Medical History:   Diagnosis Date   ??? Hypertension    ??? MI (myocardial infarction) [facility])        Past Surgical History:   Procedure Laterality Date   ??? HX APPENDECTOMY     ??? HX HERNIA REPAIR           History reviewed. No pertinent family history.    Social History     Socioeconomic History   ??? Marital status: MARRIED     Spouse name: Not on file   ??? Number of children: Not on file   ??? Years of education: Not on file   ??? Highest education level: Not on file   Occupational History   ??? Not on file   Tobacco Use   ??? Smoking status: Current Every Day Smoker     Packs/day: 1.00   ??? Smokeless tobacco: Never Used   Substance and Sexual Activity   ??? Alcohol use: Not on file   ??? Drug use: Not on file   ??? Sexual activity: Not on file   Other Topics Concern   ??? Not on file   Social History Narrative   ??? Not on file     Social Determinants of Health     Financial Resource Strain:    ??? Difficulty of Paying Living Expenses: Not on file   Food Insecurity:    ??? Worried About Running Out of Food in the Last Year: Not on file   ??? Ran Out of Food in the Last Year: Not on file   Transportation Needs:    ??? Lack of Transportation (Medical): Not on file   ??? Lack of Transportation (Non-Medical): Not on file   Physical Activity:    ??? Days of Exercise per Week: Not on file   ??? Minutes of Exercise per Session: Not on file   Stress:    ??? Feeling of Stress  : Not on file   Social Connections:    ??? Frequency of Communication with Friends and Family: Not on file   ??? Frequency of Social Gatherings with Friends and Family: Not on file   ??? Attends Religious Services: Not on file   ??? Active Member of Clubs or Organizations: Not on file   ??? Attends Banker Meetings: Not on file   ??? Marital Status: Not on file   Intimate Partner Violence:    ??? Fear of Current or Ex-Partner: Not on file   ??? Emotionally Abused: Not on file   ??? Physically Abused: Not on file   ??? Sexually Abused: Not on file   Housing Stability:    ??? Unable to Pay for Housing in the Last Year: Not on file   ??? Number of Places Lived in the Last Year: Not on file   ??? Unstable Housing in the Last Year: Not on file  ALLERGIES: Patient has no known allergies.    Review of Systems   Constitutional: Negative.    HENT: Negative.    Eyes: Negative.    Respiratory: Negative.    Cardiovascular: Positive for chest pain.   Gastrointestinal: Positive for abdominal pain, nausea and vomiting.   Endocrine: Negative.    Genitourinary: Negative.    Musculoskeletal: Negative.    Allergic/Immunologic: Negative.    Neurological: Negative.    Hematological: Negative.    Psychiatric/Behavioral: Negative.    All other systems reviewed and are negative.      Vitals:    08/31/20 1219   BP: (!) 163/92   Pulse: 81   Resp: 20   Temp: 98.7 ??F (37.1 ??C)   SpO2: 96%   Weight: 73.9 kg (163 lb)   Height: 5\' 2"  (1.575 m)            Physical Exam  Vitals and nursing note reviewed.   Constitutional:       General: He is not in acute distress.     Appearance: Normal appearance. He is not ill-appearing, toxic-appearing or diaphoretic.   HENT:      Head: Normocephalic and atraumatic.      Nose: Nose normal.      Mouth/Throat:      Mouth: Mucous membranes are moist.   Eyes:      Extraocular Movements: Extraocular movements intact.      Pupils: Pupils are equal, round, and reactive to light.   Cardiovascular:      Rate and Rhythm:  Normal rate and regular rhythm.      Pulses: Normal pulses.      Heart sounds: Normal heart sounds. No murmur heard.  No friction rub. No gallop.    Pulmonary:      Effort: Pulmonary effort is normal. No respiratory distress.      Breath sounds: Normal breath sounds.   Abdominal:      General: Abdomen is flat. Bowel sounds are normal. There is no distension.      Palpations: Abdomen is soft. There is no mass.      Tenderness: There is abdominal tenderness. There is no right CVA tenderness, left CVA tenderness, guarding or rebound.      Hernia: No hernia is present.      Comments: Soft throughout.  Tender in the right lower quadrant without peritoneal signs.   Musculoskeletal:         General: No swelling, tenderness, deformity or signs of injury. Normal range of motion.      Cervical back: Normal range of motion.      Right lower leg: No edema.      Left lower leg: No edema.   Skin:     General: Skin is warm and dry.      Capillary Refill: Capillary refill takes less than 2 seconds.      Coloration: Skin is not jaundiced or pale.      Findings: No bruising, erythema, lesion or rash.   Neurological:      General: No focal deficit present.      Mental Status: He is alert and oriented to person, place, and time. Mental status is at baseline.      Cranial Nerves: No cranial nerve deficit.      Sensory: No sensory deficit.      Motor: No weakness.      Gait: Gait normal.   Psychiatric:         Mood and Affect: Mood  normal.         Behavior: Behavior normal.          MDM     There appears to be 2 processes-for the abdominal pain and surgical history and vomiting, obstruction is in the differential-check labs and CT.  Description of chest pain is suggestive of stable angina.  EKG without acute ischemia.  Check cardiac biomarkers.  Disposition based on work-up results.  Procedures

## 2020-09-01 LAB — EKG, 12 LEAD, INITIAL
Atrial Rate: 79 {beats}/min
Calculated P Axis: 41 degrees
Calculated R Axis: 7 degrees
Calculated T Axis: 6 degrees
P-R Interval: 147 ms
Q-T Interval: 373 ms
QRS Duration: 103 ms
QTC Calculation (Bezet): 428 ms
Ventricular Rate: 79 {beats}/min

## 2020-09-01 LAB — EKG 12-LEAD
Atrial Rate: 79 {beats}/min
P Axis: 41 degrees
P-R Interval: 147 ms
Q-T Interval: 373 ms
QRS Duration: 103 ms
QTc Calculation (Bazett): 428 ms
R Axis: 7 degrees
T Axis: 6 degrees
Ventricular Rate: 79 {beats}/min

## 2020-10-27 ENCOUNTER — Inpatient Hospital Stay
Admit: 2020-10-27 | Discharge: 2020-10-27 | Disposition: A | Discharge status: Home / Self Care | Payer: MEDICARE | Attending: Emergency Medicine

## 2020-10-27 DIAGNOSIS — K047 Periapical abscess without sinus: Secondary | ICD-10-CM

## 2020-10-27 MED ORDER — IBUPROFEN 600 MG TAB
600 mg | ORAL_TABLET | Freq: Three times a day (TID) | ORAL | 0 refills | Status: AC | PRN
Start: 2020-10-27 — End: ?

## 2020-10-27 MED ORDER — IBUPROFEN 600 MG TAB
600 mg | Freq: Once | ORAL | Status: AC
Start: 2020-10-27 — End: 2020-10-27
  Administered 2020-10-27: 19:00:00 via ORAL

## 2020-10-27 MED ORDER — HYDROCODONE-ACETAMINOPHEN 5 MG-325 MG TAB
5-325 mg | ORAL_TABLET | Freq: Four times a day (QID) | ORAL | 0 refills | Status: AC | PRN
Start: 2020-10-27 — End: 2020-10-30

## 2020-10-27 MED ORDER — AMOXICILLIN CLAVULANATE 875 MG-125 MG TAB
875-125 mg | ORAL | Status: AC
Start: 2020-10-27 — End: 2020-10-27
  Administered 2020-10-27: 19:00:00 via ORAL

## 2020-10-27 MED ORDER — HYDROCODONE-ACETAMINOPHEN 5 MG-325 MG TAB
5-325 mg | Freq: Once | ORAL | Status: AC
Start: 2020-10-27 — End: 2020-10-27
  Administered 2020-10-27: 19:00:00 via ORAL

## 2020-10-27 MED ORDER — AMOXICILLIN CLAVULANATE 875 MG-125 MG TAB
875-125 mg | ORAL_TABLET | Freq: Two times a day (BID) | ORAL | 0 refills | Status: AC
Start: 2020-10-27 — End: 2020-11-03

## 2020-10-27 MED FILL — IBUPROFEN 600 MG TAB: 600 mg | ORAL | Qty: 1

## 2020-10-27 MED FILL — AMOXICILLIN CLAVULANATE 875 MG-125 MG TAB: 875-125 mg | ORAL | Qty: 1

## 2020-10-27 MED FILL — HYDROCODONE-ACETAMINOPHEN 5 MG-325 MG TAB: 5-325 mg | ORAL | Qty: 1

## 2020-10-27 NOTE — ED Notes (Signed)
Patient reports swelling & redness below his tooth on the left lower side for 2-3 with a lot of pain.

## 2020-10-27 NOTE — ED Provider Notes (Signed)
ED Provider Notes by Addison Lank, MD at 10/27/20 1333                Author: Addison Lank, MD  Service: Emergency Medicine  Author Type: Physician       Filed: 10/27/20 1335  Date of Service: 10/27/20 1333  Status: Signed          Editor: Addison Lank, MD (Physician)               HPI 65 year old male with history of hypertension previous MI presents ED with a lower molar tooth ache beginning 2 days ago  increasing pain over the last 12 hours now draining purulent material        Past Medical History:        Diagnosis  Date         ?  Hypertension           ?  MI (myocardial infarction) [facility])               Past Surgical History:         Procedure  Laterality  Date          ?  HX APPENDECTOMY              ?  HX HERNIA REPAIR                 No family history on file.        Social History          Socioeconomic History         ?  Marital status:  MARRIED              Spouse name:  Not on file         ?  Number of children:  Not on file     ?  Years of education:  Not on file     ?  Highest education level:  Not on file       Occupational History        ?  Not on file       Tobacco Use         ?  Smoking status:  Current Every Day Smoker              Packs/day:  1.00         ?  Smokeless tobacco:  Never Used       Substance and Sexual Activity         ?  Alcohol use:  Not on file     ?  Drug use:  Not on file     ?  Sexual activity:  Not on file        Other Topics  Concern        ?  Not on file       Social History Narrative        ?  Not on file          Social Determinants of Health          Financial Resource Strain:         ?  Difficulty of Paying Living Expenses: Not on file       Food Insecurity:         ?  Worried About Running Out of Food in the Last Year: Not on file     ?  Ran Out of Food in the  Last Year: Not on file       Transportation Needs:         ?  Lack of Transportation (Medical): Not on file     ?  Lack of Transportation (Non-Medical): Not on file       Physical Activity:         ?   Days of Exercise per Week: Not on file     ?  Minutes of Exercise per Session: Not on file       Stress:         ?  Feeling of Stress : Not on file       Social Connections:         ?  Frequency of Communication with Friends and Family: Not on file     ?  Frequency of Social Gatherings with Friends and Family: Not on file     ?  Attends Religious Services: Not on file     ?  Active Member of Clubs or Organizations: Not on file     ?  Attends Banker Meetings: Not on file     ?  Marital Status: Not on file       Intimate Partner Violence:         ?  Fear of Current or Ex-Partner: Not on file     ?  Emotionally Abused: Not on file     ?  Physically Abused: Not on file     ?  Sexually Abused: Not on file       Housing Stability:         ?  Unable to Pay for Housing in the Last Year: Not on file     ?  Number of Places Lived in the Last Year: Not on file        ?  Unstable Housing in the Last Year: Not on file              ALLERGIES: Patient has no known allergies.      Review of Systems    All other systems reviewed and are negative.           Vitals:          10/27/20 1306        BP:  (!) 161/97     Pulse:  94     Resp:  18     Temp:  98.3 ??F (36.8 ??C)     SpO2:  99%     Weight:  73.9 kg (163 lb)        Height:  5\' 2"  (1.575 m)                Physical Exam   Vitals and nursing note reviewed.   Constitutional:        General: He is not in acute distress.     Appearance: He is normal weight. He is not ill-appearing.    HENT:       Head: Normocephalic and atraumatic.      Nose: Nose normal.      Mouth/Throat:      Mouth: Mucous membranes are moist.      Tongue: No lesions. Tongue does not deviate from midline.      Palate: No mass and lesions.      Pharynx:  Oropharynx is clear. No pharyngeal swelling or oropharyngeal exudate.      Tonsils: No tonsillar exudate.  Comments: The left lower first molar and canine incisors are grossly carious patient is missing many lower teeth there is an abscess  on  the buccal side that is draining purulent material spontaneously; no trismus swallowing normally  Musculoskeletal:      Cervical back: Normal range of motion and neck supple.     Neurological:       General: No focal deficit present.      Mental Status: He is alert.    Psychiatric:         Mood and Affect: Mood normal.         Behavior: Behavior normal.              MDM    Dental abscess currently draining with grossly carious disease treat with Augmentin pain medication suggest dental follow-up      Procedures

## 2023-12-26 ENCOUNTER — Encounter

## 2024-01-06 ENCOUNTER — Ambulatory Visit: Payer: MEDICARE | Primary: Internal Medicine

## 2024-06-14 ENCOUNTER — Inpatient Hospital Stay
Admit: 2024-06-14 | Discharge: 2024-06-14 | Disposition: A | Discharge status: Home / Self Care | Payer: Medicare (Managed Care) | Arrived: WI | Attending: Student in an Organized Health Care Education/Training Program

## 2024-06-14 DIAGNOSIS — S61214A Laceration without foreign body of right ring finger without damage to nail, initial encounter: Secondary | ICD-10-CM

## 2024-06-14 MED ORDER — ACETAMINOPHEN 325 MG PO TABS
325 | ORAL | Status: DC
Start: 2024-06-14 — End: 2024-06-14

## 2024-06-14 MED ORDER — ACETAMINOPHEN 325 MG PO TABS
325 | ORAL_TABLET | Freq: Four times a day (QID) | ORAL | 3 refills | Status: AC | PRN
Start: 2024-06-14 — End: ?

## 2024-06-14 MED ORDER — TETANUS-DIPHTHERIA TOXOIDS TD 5-2 LFU IM INJ
5-2 | Freq: Once | INTRAMUSCULAR | Status: AC
Start: 2024-06-14 — End: 2024-06-14
  Administered 2024-06-14: 22:00:00 0.5 mL via INTRAMUSCULAR

## 2024-06-14 MED ORDER — LIDOCAINE HCL 1 % IJ SOLN
1 | Freq: Once | INTRAMUSCULAR | Status: AC
Start: 2024-06-14 — End: 2024-06-14
  Administered 2024-06-14: 22:00:00 10 mL via INTRADERMAL

## 2024-06-14 MED FILL — TENIVAC 5-2 LFU IM INJ: 5-2 LFU | INTRAMUSCULAR | Qty: 0.5 | Fill #0

## 2024-06-14 MED FILL — XYLOCAINE 1 % IJ SOLN: 1 % | INTRAMUSCULAR | Qty: 20 | Fill #0

## 2024-06-14 MED FILL — ACETAMINOPHEN 325 MG PO TABS: 325 mg | ORAL | Qty: 2 | Fill #0

## 2024-06-14 NOTE — ED Triage Notes (Signed)
 Pt reported he lacerated his right ring finger cleaning up rusty metal in his back yard pt unsure of last tetanus shot, bleeding is controled in triage, wound cleaned with iodine and sterile water in triage

## 2024-06-14 NOTE — ED Notes (Signed)
 Pt given discharge and follow up instructions. Education on prescriptions given. Pt given education on suture care.

## 2024-06-14 NOTE — Discharge Instructions (Signed)
 You were seen for a laceration (cut) to your finger. You will need to have the stitches removed in about 10 days. You may return to the ER or see your PCP for this.    Return to the ER sooner if you have any redness, swelling or pus as these could be signs of infection.

## 2024-06-14 NOTE — ED Provider Notes (Signed)
 [facility] EMERGENCY DEPT  EMERGENCY DEPARTMENT HISTORY AND PHYSICAL EXAM      Date of evaluation: 06/14/2024  Patient Name: Gregory Huffman 1956/06/03  MRN: [id number]  ED Provider: Doyal Cheron Gins, MD   Note Started: 11:29 PM EDT 06/14/24    HISTORY OF PRESENT ILLNESS     Chief Complaint   Patient presents with    Laceration       History Provided By: Patient    HPI: Gregory Huffman is a 68 y.o. male who presents with finger laceration. Pt reports he was clearing out his yard and accidentally picked up a piece of metal with a sharp edge, cutting his finger. No other injuries.    PAST MEDICAL HISTORY   Past Medical History:  Past Medical History:   Diagnosis Date    Hypertension     MI (myocardial infarction) (HCC)        Past Surgical History:  Past Surgical History:   Procedure Laterality Date    APPENDECTOMY      HERNIA REPAIR         Family History:  History reviewed. No pertinent family history.    Social History:  Social History     Tobacco Use    Smoking status: Every Day     Current packs/day: 1.00     Types: Cigarettes    Smokeless tobacco: Never       Allergies:  No Known Allergies    PCP: Marcelin, Fitzgerald Sr., MD    Current Meds:   No current facility-administered medications for this encounter.     Current Outpatient Medications   Medication Sig Dispense Refill    acetaminophen  (AMINOFEN) 325 MG tablet Take 2 tablets by mouth every 6 hours as needed for Pain 120 tablet 3    ibuprofen (ADVIL;MOTRIN) 600 MG tablet Take 1 tablet by mouth every 8 hours as needed      ondansetron (ZOFRAN-ODT) 4 MG disintegrating tablet Place 1 tablet under the tongue every 8 hours as needed         Social Determinants of Health:   Social Drivers of Health     Tobacco Use: High Risk (06/14/2024)    Patient History     Smoking Tobacco Use: Every Day     Smokeless Tobacco Use: Never     Passive Exposure: Not on file   Alcohol Use: Not on file   Financial Resource Strain: Not on file   Food Insecurity: Not on file    Transportation Needs: Not on file   Physical Activity: Not on file   Stress: Not on file   Social Connections: Not on file   Intimate Partner Violence: Not on file   Depression: Not on file   Housing Stability: Not on file   Interpersonal Safety: Not on file   Utilities: Not on file     PHYSICAL EXAM   Physical Exam  Constitutional:       General: He is not in acute distress.  HENT:      Head: Normocephalic and atraumatic.      Mouth/Throat:      Mouth: Mucous membranes are moist.   Cardiovascular:      Rate and Rhythm: Normal rate and regular rhythm.   Pulmonary:      Effort: Pulmonary effort is normal. No respiratory distress.      Breath sounds: Normal breath sounds.   Musculoskeletal:      Cervical back: Neck supple.   Skin:  General: Skin is warm and dry.      Comments: 1.5cm laceration volar aspect right ring finger over DIP  Full ROM no evidence of tendon injury   Neurological:      Mental Status: He is alert and oriented to person, place, and time.         SCREENINGS                No data recorded       LAB, EKG AND DIAGNOSTIC RESULTS   Labs:  No results found for this or any previous visit (from the past 12 hours).    EKG:.Not Applicable     Radiologic Studies:  Radiographic images are visualized and preliminarily interpreted by the ED Provider with the following findings: Not Applicable..     Interpretation per the Radiologist below, if available at the time of this note:  No orders to display        Records Reviewed: Not Applicable    MEDICAL DECISION MAKING and ED COURSE   Differential and Considerations of tests not ordered: 68 yo M presenting with finger laceration. VS stable. Laceration repaired at bedside. Tdap updated. Stable for discharge.     Vitals:    Vitals:    06/14/24 1752   BP: (!) 169/87   Pulse: 83   Resp: 18   Temp: 97.9 F (36.6 C)   TempSrc: Skin   SpO2: 95%   Weight: 67.6 kg (149 lb)   Height: 1.575 m (5' 2)       ED COURSE       Clinical Management Tools:      Smoking  Cessation: Not Applicable    Patient was given the following medications:  Medications   tetanus & diphtheria toxoids (adult) 5-2 LFU injection 0.5 mL (0.5 mLs IntraMUSCular Given 06/14/24 1809)   lidocaine  1 % injection 10 mL (10 mLs IntraDERmal Given 06/14/24 1813)       CONSULTS: See ED Course/MDM for further details.  None     PROCEDURES   Unless otherwise noted, none  Procedures      Procedure Note - Laceration Repair:  11:34 PM EDT   Procedure by Doyal Cheron Gins, MD   2cm linear laceration to ring finger  was Extensively irrigated with saline under jet lavage, prepped with Betadine and draped in a sterile fashion.  The area was anesthetized via digital block of 2 mL Lidocaine  1%.    The wound was explored with the following results: No foreign bodies found, No tendon laceration seen.  The wound was repaired with One layer suture closure: Skin Layer:  4 sutures placed, stitch type:simple interrupted, suture: 5-0 nylon.  The wound was closed with good hemostasis and approximation.  Sterile dressing applied.  Lip laceration: Not Applicable  Simple (Single-layer AND simple cleaning)  Estimated blood loss: minimal  The procedure took 1-15 minutes, and patient tolerated well.      SEPSIS REASSESSMENT & CRITICAL CARE TIME   SEPSIS REASSESSMENT: No suspicion of bacterial infection and not having 2 SIRS during this visit.     Patient does not meet Critical Care Time, 0 minutes  CLINICAL IMPRESSIONS     1. Laceration of right ring finger without foreign body without damage to nail, initial encounter         SDOH/DISPOSITION/PLAN   Social Determinants affecting Treatment Plan: Patient cannot afford medications. Given first dose here. Discussed affordable options (GoodRx, [facility] $5 list, etc)    DISPOSITION Decision To  Discharge 06/14/2024 06:29:52 PM   DISPOSITION CONDITION Stable         Discharge Note: The patient is stable for discharge home. The signs, symptoms, diagnosis, and discharge instructions have been  discussed, understanding conveyed, and agreed upon. The patient is to follow up as recommended or return to ER should their symptoms worsen.      PATIENT REFERRED TO:  No follow-up provider specified.      DISCHARGE MEDICATIONS:     Medication List        START taking these medications      acetaminophen  325 MG tablet  Commonly known as: Aminofen  Take 2 tablets by mouth every 6 hours as needed for Pain            ASK your doctor about these medications      ibuprofen 600 MG tablet  Commonly known as: ADVIL;MOTRIN     ondansetron 4 MG disintegrating tablet  Commonly known as: ZOFRAN-ODT               Where to Get Your Medications        These medications were sent to [facility] - [place], [place] - [address] - P [phone] [address] [facility]  [address] [place], [place] [address]      Phone: [phone]   acetaminophen  325 MG tablet           DISCONTINUED MEDICATIONS:  Discharge Medication List as of 06/14/2024  6:32 PM          I am the Primary Clinician of Record. Doyal Cheron Gins, MD (electronically signed)    (Please note that parts of this dictation were completed with voice recognition software. Quite often unanticipated grammatical, syntax, homophones, and other interpretive errors are inadvertently transcribed by the computer software. Please disregards these errors. Please excuse any errors that have escaped final proofreading.)         Gins Doyal Cheron, MD  06/14/24 [phone]
